# Patient Record
Sex: Male | Born: 1970 | Marital: Single
Health system: Southern US, Community
[De-identification: ages and names within clinical notes are randomized; demographics above are authoritative.]

---

## 2012-11-30 ENCOUNTER — Inpatient Hospital Stay: Payer: Self-pay | Admitting: Internal Medicine

## 2012-11-30 DIAGNOSIS — I517 Cardiomegaly: Secondary | ICD-10-CM

## 2012-11-30 LAB — MAGNESIUM: Magnesium: 1.9 mg/dL

## 2012-11-30 LAB — COMPREHENSIVE METABOLIC PANEL
Alkaline Phosphatase: 104 U/L (ref 50–136)
Anion Gap: 10 (ref 7–16)
Bilirubin,Total: 0.3 mg/dL (ref 0.2–1.0)
Calcium, Total: 9.3 mg/dL (ref 8.5–10.1)
Creatinine: 2.04 mg/dL — ABNORMAL HIGH (ref 0.60–1.30)
Osmolality: 286 (ref 275–301)
SGPT (ALT): 36 U/L (ref 12–78)
Sodium: 138 mmol/L (ref 136–145)
Total Protein: 9.2 g/dL — ABNORMAL HIGH (ref 6.4–8.2)

## 2012-11-30 LAB — URINALYSIS, COMPLETE
Bacteria: NONE SEEN
Cellular Cast: 7
Leukocyte Esterase: NEGATIVE
Nitrite: NEGATIVE
RBC,UR: 12 /HPF (ref 0–5)
Specific Gravity: 1.026 (ref 1.003–1.030)
Squamous Epithelial: 4
WBC UR: 9 /HPF (ref 0–5)

## 2012-11-30 LAB — CBC
HCT: 43.5 % (ref 40.0–52.0)
MCHC: 33 g/dL (ref 32.0–36.0)
Platelet: 170 10*3/uL (ref 150–440)
RBC: 4.51 10*6/uL (ref 4.40–5.90)
RDW: 15.4 % — ABNORMAL HIGH (ref 11.5–14.5)
WBC: 9.7 10*3/uL (ref 3.8–10.6)

## 2012-11-30 LAB — HEMOGLOBIN A1C: Hemoglobin A1C: 6.1 % (ref 4.2–6.3)

## 2012-11-30 LAB — DRUG SCREEN, URINE
Barbiturates, Ur Screen: NEGATIVE (ref ?–200)
Methadone, Ur Screen: NEGATIVE (ref ?–300)

## 2012-11-30 LAB — LIPASE, BLOOD: Lipase: 166 U/L (ref 73–393)

## 2012-11-30 LAB — PROTIME-INR
INR: 1
Prothrombin Time: 13.2 secs (ref 11.5–14.7)

## 2012-11-30 LAB — CK TOTAL AND CKMB (NOT AT ARMC)
CK, Total: 373 U/L — ABNORMAL HIGH (ref 35–232)
CK-MB: 1 ng/mL (ref 0.5–3.6)

## 2012-12-01 LAB — BASIC METABOLIC PANEL WITH GFR
Anion Gap: 4 — ABNORMAL LOW
BUN: 22 mg/dL — ABNORMAL HIGH
Calcium, Total: 8.5 mg/dL
Chloride: 104 mmol/L
Co2: 27 mmol/L
Creatinine: 1.8 mg/dL — ABNORMAL HIGH
EGFR (African American): 53 — ABNORMAL LOW
EGFR (Non-African Amer.): 46 — ABNORMAL LOW
Glucose: 98 mg/dL
Osmolality: 273
Potassium: 4.5 mmol/L
Sodium: 135 mmol/L — ABNORMAL LOW

## 2012-12-01 LAB — CBC WITH DIFFERENTIAL/PLATELET
Basophil #: 0 x10 3/mm 3
Basophil %: 0.3 %
Eosinophil #: 0.1 x10 3/mm 3
Eosinophil %: 0.7 %
HCT: 39.9 % — ABNORMAL LOW
HGB: 13.4 g/dL
Lymphocyte %: 12.5 %
Lymphs Abs: 1.3 x10 3/mm 3
MCH: 31.9 pg
MCHC: 33.7 g/dL
MCV: 95 fL
Monocyte #: 1.4 "x10 3/mm " — ABNORMAL HIGH
Monocyte %: 13.6 %
Neutrophil #: 7.5 x10 3/mm 3 — ABNORMAL HIGH
Neutrophil %: 72.9 %
Platelet: 149 x10 3/mm 3 — ABNORMAL LOW
RBC: 4.22 x10 6/mm 3 — ABNORMAL LOW
RDW: 15.4 % — ABNORMAL HIGH
WBC: 10.2 x10 3/mm 3

## 2012-12-01 LAB — MAGNESIUM: Magnesium: 1.7 mg/dL — ABNORMAL LOW

## 2012-12-01 LAB — APTT
Activated PTT: 112.8 s — ABNORMAL HIGH
Activated PTT: 92 s — ABNORMAL HIGH

## 2012-12-01 LAB — TSH: Thyroid Stimulating Horm: 1.25 u[IU]/mL

## 2012-12-02 LAB — BASIC METABOLIC PANEL
Anion Gap: 7 (ref 7–16)
Calcium, Total: 9.1 mg/dL (ref 8.5–10.1)
Chloride: 102 mmol/L (ref 98–107)
Co2: 25 mmol/L (ref 21–32)
Creatinine: 1.5 mg/dL — ABNORMAL HIGH (ref 0.60–1.30)
EGFR (African American): >60
Osmolality: 269 (ref 275–301)

## 2012-12-02 LAB — APTT: Activated PTT: 75 secs — ABNORMAL HIGH (ref 23.6–35.9)

## 2012-12-03 LAB — BASIC METABOLIC PANEL
Anion Gap: 6 — ABNORMAL LOW (ref 7–16)
BUN: 14 mg/dL (ref 7–18)
Calcium, Total: 9.2 mg/dL (ref 8.5–10.1)
Chloride: 104 mmol/L (ref 98–107)
Creatinine: 1.4 mg/dL — ABNORMAL HIGH (ref 0.60–1.30)
EGFR (African American): >60
EGFR (Non-African Amer.): >60
Osmolality: 272 (ref 275–301)
Potassium: 4.4 mmol/L (ref 3.5–5.1)

## 2012-12-03 LAB — PLATELET COUNT: Platelet: 179 10*3/uL (ref 150–440)

## 2012-12-03 LAB — HEMOGLOBIN: HGB: 13 g/dL (ref 13.0–18.0)

## 2012-12-03 LAB — APTT: Activated PTT: 51.5 secs — ABNORMAL HIGH (ref 23.6–35.9)

## 2012-12-04 LAB — BASIC METABOLIC PANEL
Anion Gap: 5 — ABNORMAL LOW (ref 7–16)
BUN: 13 mg/dL (ref 7–18)
Chloride: 105 mmol/L (ref 98–107)
Co2: 27 mmol/L (ref 21–32)
Creatinine: 1.39 mg/dL — ABNORMAL HIGH (ref 0.60–1.30)
EGFR (African American): >60
EGFR (Non-African Amer.): >60
Sodium: 137 mmol/L (ref 136–145)

## 2012-12-05 LAB — PLATELET COUNT: Platelet: 121 10*3/uL — ABNORMAL LOW (ref 150–440)

## 2012-12-05 LAB — APTT: Activated PTT: 35.9 secs (ref 23.6–35.9)

## 2012-12-05 LAB — HEMOGLOBIN: HGB: 11 g/dL — ABNORMAL LOW (ref 13.0–18.0)

## 2012-12-06 LAB — CBC WITH DIFFERENTIAL/PLATELET
Basophil #: 0 10*3/uL (ref 0.0–0.1)
Basophil %: 0.2 %
Eosinophil #: 0.2 10*3/uL (ref 0.0–0.7)
Eosinophil %: 2.3 %
HCT: 30.9 % — ABNORMAL LOW (ref 40.0–52.0)
HGB: 10.6 g/dL — ABNORMAL LOW (ref 13.0–18.0)
Lymphocyte #: 0.7 10*3/uL — ABNORMAL LOW (ref 1.0–3.6)
Lymphocyte %: 7.1 %
Monocyte #: 1.2 x10 3/mm — ABNORMAL HIGH (ref 0.2–1.0)
Neutrophil %: 78.3 %
Platelet: 99 10*3/uL — ABNORMAL LOW (ref 150–440)
RBC: 3.27 10*6/uL — ABNORMAL LOW (ref 4.40–5.90)
RDW: 15.1 % — ABNORMAL HIGH (ref 11.5–14.5)
WBC: 10.1 10*3/uL (ref 3.8–10.6)

## 2012-12-06 LAB — PROTIME-INR
INR: 1.2
Prothrombin Time: 14.9 secs — ABNORMAL HIGH (ref 11.5–14.7)

## 2012-12-06 LAB — CREATININE, SERUM
EGFR (African American): 17 — ABNORMAL LOW
EGFR (Non-African Amer.): 14 — ABNORMAL LOW

## 2012-12-07 LAB — CBC WITH DIFFERENTIAL/PLATELET
Basophil #: 0 10*3/uL (ref 0.0–0.1)
Basophil %: 0.2 %
Eosinophil #: 0.1 10*3/uL (ref 0.0–0.7)
Eosinophil %: 1.2 %
HCT: 28.7 % — ABNORMAL LOW (ref 40.0–52.0)
HGB: 9.5 g/dL — ABNORMAL LOW (ref 13.0–18.0)
Lymphocyte #: 0.8 10*3/uL — ABNORMAL LOW (ref 1.0–3.6)
Lymphocyte %: 6.8 %
MCH: 31 pg (ref 26.0–34.0)
MCHC: 33.1 g/dL (ref 32.0–36.0)
MCV: 94 fL (ref 80–100)
Monocyte #: 1.4 x10 3/mm — ABNORMAL HIGH (ref 0.2–1.0)
Neutrophil %: 79.5 %
RBC: 3.06 10*6/uL — ABNORMAL LOW (ref 4.40–5.90)
RDW: 14.8 % — ABNORMAL HIGH (ref 11.5–14.5)

## 2012-12-07 LAB — BASIC METABOLIC PANEL
BUN: 44 mg/dL — ABNORMAL HIGH (ref 7–18)
Co2: 25 mmol/L (ref 21–32)
Creatinine: 5.4 mg/dL — ABNORMAL HIGH (ref 0.60–1.30)
EGFR (African American): 14 — ABNORMAL LOW
EGFR (Non-African Amer.): 12 — ABNORMAL LOW
Glucose: 98 mg/dL (ref 65–99)
Sodium: 131 mmol/L — ABNORMAL LOW (ref 136–145)

## 2012-12-07 LAB — PROTIME-INR: INR: 1.2

## 2012-12-08 LAB — BASIC METABOLIC PANEL
BUN: 42 mg/dL — ABNORMAL HIGH (ref 7–18)
Calcium, Total: 8.4 mg/dL — ABNORMAL LOW (ref 8.5–10.1)
Creatinine: 5.16 mg/dL — ABNORMAL HIGH (ref 0.60–1.30)
EGFR (African American): 15 — ABNORMAL LOW
Glucose: 105 mg/dL — ABNORMAL HIGH (ref 65–99)
Osmolality: 279 (ref 275–301)
Potassium: 5.3 mmol/L — ABNORMAL HIGH (ref 3.5–5.1)

## 2012-12-08 LAB — URIC ACID: Uric Acid: 5.5 mg/dL (ref 3.5–7.2)

## 2012-12-08 LAB — CK: CK, Total: 127 U/L (ref 35–232)

## 2012-12-08 LAB — PROTIME-INR
INR: 1.4
Prothrombin Time: 16.7 secs — ABNORMAL HIGH (ref 11.5–14.7)

## 2012-12-08 LAB — PHOSPHORUS: Phosphorus: 4.2 mg/dL (ref 2.5–4.9)

## 2012-12-09 LAB — BASIC METABOLIC PANEL
Anion Gap: 4 — ABNORMAL LOW (ref 7–16)
BUN: 43 mg/dL — ABNORMAL HIGH (ref 7–18)
Calcium, Total: 8.8 mg/dL (ref 8.5–10.1)
Chloride: 104 mmol/L (ref 98–107)
Co2: 24 mmol/L (ref 21–32)
Creatinine: 4.89 mg/dL — ABNORMAL HIGH (ref 0.60–1.30)
EGFR (African American): 16 — ABNORMAL LOW
Osmolality: 275 (ref 275–301)

## 2012-12-09 LAB — CBC WITH DIFFERENTIAL/PLATELET
Eosinophil #: 0.2 10*3/uL (ref 0.0–0.7)
Eosinophil %: 1.6 %
HCT: 27.2 % — ABNORMAL LOW (ref 40.0–52.0)
Lymphocyte #: 0.8 10*3/uL — ABNORMAL LOW (ref 1.0–3.6)
MCH: 32 pg (ref 26.0–34.0)
Monocyte #: 1.8 x10 3/mm — ABNORMAL HIGH (ref 0.2–1.0)
Monocyte %: 14.3 %
Platelet: 171 10*3/uL (ref 150–440)
WBC: 12.7 10*3/uL — ABNORMAL HIGH (ref 3.8–10.6)

## 2012-12-09 LAB — PROTIME-INR: INR: 1.4

## 2012-12-09 LAB — PROTEIN / CREATININE RATIO, URINE: Creatinine, Urine: 93 mg/dL (ref 30.0–125.0)

## 2012-12-10 LAB — BASIC METABOLIC PANEL
Anion Gap: 6 — ABNORMAL LOW (ref 7–16)
BUN: 45 mg/dL — ABNORMAL HIGH (ref 7–18)
Calcium, Total: 8.3 mg/dL — ABNORMAL LOW (ref 8.5–10.1)
Chloride: 106 mmol/L (ref 98–107)
Co2: 21 mmol/L (ref 21–32)
Creatinine: 4.81 mg/dL — ABNORMAL HIGH (ref 0.60–1.30)
EGFR (African American): 16 — ABNORMAL LOW
EGFR (Non-African Amer.): 14 — ABNORMAL LOW
Glucose: 116 mg/dL — ABNORMAL HIGH (ref 65–99)
Osmolality: 279 (ref 275–301)

## 2012-12-10 LAB — CBC WITH DIFFERENTIAL/PLATELET
Basophil %: 0.5 %
Eosinophil #: 0.2 10*3/uL (ref 0.0–0.7)
Eosinophil %: 1.3 %
HCT: 25.9 % — ABNORMAL LOW (ref 40.0–52.0)
HGB: 8.7 g/dL — ABNORMAL LOW (ref 13.0–18.0)
MCH: 31.1 pg (ref 26.0–34.0)
MCHC: 33.4 g/dL (ref 32.0–36.0)
MCV: 93 fL (ref 80–100)
Neutrophil #: 11.1 10*3/uL — ABNORMAL HIGH (ref 1.4–6.5)
RBC: 2.78 10*6/uL — ABNORMAL LOW (ref 4.40–5.90)
RDW: 14.4 % (ref 11.5–14.5)

## 2012-12-10 LAB — PROTIME-INR
INR: 1.5
Prothrombin Time: 18 secs — ABNORMAL HIGH (ref 11.5–14.7)

## 2012-12-10 LAB — POTASSIUM: Potassium: 5.1 mmol/L (ref 3.5–5.1)

## 2012-12-10 LAB — APTT: Activated PTT: 45.9 secs — ABNORMAL HIGH (ref 23.6–35.9)

## 2012-12-11 LAB — BASIC METABOLIC PANEL
Anion Gap: 5 — ABNORMAL LOW (ref 7–16)
BUN: 44 mg/dL — ABNORMAL HIGH (ref 7–18)
Calcium, Total: 8.3 mg/dL — ABNORMAL LOW (ref 8.5–10.1)
Chloride: 106 mmol/L (ref 98–107)
EGFR (African American): 16 — ABNORMAL LOW
Glucose: 101 mg/dL — ABNORMAL HIGH (ref 65–99)
Osmolality: 285 (ref 275–301)
Potassium: 5 mmol/L (ref 3.5–5.1)

## 2012-12-11 LAB — CBC WITH DIFFERENTIAL/PLATELET
Basophil #: 0.1 10*3/uL (ref 0.0–0.1)
Basophil %: 0.6 %
Eosinophil #: 0.3 10*3/uL (ref 0.0–0.7)
HGB: 8.5 g/dL — ABNORMAL LOW (ref 13.0–18.0)
Lymphocyte %: 8.4 %
MCH: 30.9 pg (ref 26.0–34.0)
MCHC: 33.1 g/dL (ref 32.0–36.0)
MCV: 94 fL (ref 80–100)
Monocyte #: 1.4 x10 3/mm — ABNORMAL HIGH (ref 0.2–1.0)
Monocyte %: 12 %
Neutrophil #: 9.2 10*3/uL — ABNORMAL HIGH (ref 1.4–6.5)
Platelet: 268 10*3/uL (ref 150–440)
WBC: 12.1 10*3/uL — ABNORMAL HIGH (ref 3.8–10.6)

## 2012-12-11 LAB — APTT
Activated PTT: >160 secs (ref 23.6–35.9)
Activated PTT: 95.5 secs — ABNORMAL HIGH (ref 23.6–35.9)

## 2012-12-11 LAB — PROTIME-INR: INR: 1.9

## 2012-12-12 LAB — CBC WITH DIFFERENTIAL/PLATELET
Basophil #: 0.1 10*3/uL (ref 0.0–0.1)
HCT: 22.8 % — ABNORMAL LOW (ref 40.0–52.0)
Lymphocyte #: 0.8 10*3/uL — ABNORMAL LOW (ref 1.0–3.6)
MCH: 32.5 pg (ref 26.0–34.0)
MCHC: 34.9 g/dL (ref 32.0–36.0)
Monocyte #: 1.3 x10 3/mm — ABNORMAL HIGH (ref 0.2–1.0)
Monocyte %: 12 %
Neutrophil #: 8.5 10*3/uL — ABNORMAL HIGH (ref 1.4–6.5)
Platelet: 355 10*3/uL (ref 150–440)
RBC: 2.45 10*6/uL — ABNORMAL LOW (ref 4.40–5.90)
RDW: 14.7 % — ABNORMAL HIGH (ref 11.5–14.5)
WBC: 11 10*3/uL — ABNORMAL HIGH (ref 3.8–10.6)

## 2012-12-12 LAB — PROTIME-INR
INR: 1.9
Prothrombin Time: 21.5 secs — ABNORMAL HIGH (ref 11.5–14.7)

## 2012-12-12 LAB — BASIC METABOLIC PANEL
Anion Gap: 7 (ref 7–16)
BUN: 46 mg/dL — ABNORMAL HIGH (ref 7–18)
Calcium, Total: 8.4 mg/dL — ABNORMAL LOW (ref 8.5–10.1)
Co2: 21 mmol/L (ref 21–32)
Creatinine: 4.81 mg/dL — ABNORMAL HIGH (ref 0.60–1.30)
EGFR (African American): 16 — ABNORMAL LOW
Glucose: 113 mg/dL — ABNORMAL HIGH (ref 65–99)
Potassium: 4.8 mmol/L (ref 3.5–5.1)
Sodium: 135 mmol/L — ABNORMAL LOW (ref 136–145)

## 2012-12-12 LAB — APTT
Activated PTT: >160 secs (ref 23.6–35.9)
Activated PTT: 73 secs — ABNORMAL HIGH (ref 23.6–35.9)

## 2012-12-13 LAB — URINALYSIS, COMPLETE
Bilirubin,UR: NEGATIVE
Ketone: NEGATIVE
Leukocyte Esterase: NEGATIVE
Nitrite: NEGATIVE
Ph: 5 (ref 4.5–8.0)
Protein: NEGATIVE
RBC,UR: 1 /HPF (ref 0–5)
Specific Gravity: 1.006 (ref 1.003–1.030)
WBC UR: 1 /HPF (ref 0–5)

## 2012-12-13 LAB — BASIC METABOLIC PANEL
Anion Gap: 5 — ABNORMAL LOW (ref 7–16)
BUN: 46 mg/dL — ABNORMAL HIGH (ref 7–18)
Chloride: 108 mmol/L — ABNORMAL HIGH (ref 98–107)
Co2: 25 mmol/L (ref 21–32)
EGFR (African American): 16 — ABNORMAL LOW
Sodium: 138 mmol/L (ref 136–145)

## 2012-12-13 LAB — APTT: Activated PTT: 111.8 secs — ABNORMAL HIGH (ref 23.6–35.9)

## 2012-12-13 LAB — CBC WITH DIFFERENTIAL/PLATELET
Eosinophil #: 0.4 10*3/uL (ref 0.0–0.7)
HCT: 25.1 % — ABNORMAL LOW (ref 40.0–52.0)
HGB: 8.4 g/dL — ABNORMAL LOW (ref 13.0–18.0)
Neutrophil #: 8.3 10*3/uL — ABNORMAL HIGH (ref 1.4–6.5)
Neutrophil %: 75.4 %
Platelet: 399 10*3/uL (ref 150–440)
RBC: 2.65 10*6/uL — ABNORMAL LOW (ref 4.40–5.90)
RDW: 14.8 % — ABNORMAL HIGH (ref 11.5–14.5)

## 2012-12-13 LAB — PROTEIN / CREATININE RATIO, URINE
Creatinine, Urine: 53.4 mg/dL (ref 30.0–125.0)
Protein, Random Urine: 11 mg/dL (ref 0–12)

## 2012-12-13 LAB — PROTIME-INR
INR: 2.4
Prothrombin Time: 25.6 secs — ABNORMAL HIGH (ref 11.5–14.7)

## 2012-12-14 LAB — BASIC METABOLIC PANEL
BUN: 42 mg/dL — ABNORMAL HIGH (ref 7–18)
Calcium, Total: 8.3 mg/dL — ABNORMAL LOW (ref 8.5–10.1)
Creatinine: 4.4 mg/dL — ABNORMAL HIGH (ref 0.60–1.30)
EGFR (Non-African Amer.): 16 — ABNORMAL LOW
Osmolality: 289 (ref 275–301)

## 2012-12-14 LAB — PROTIME-INR: Prothrombin Time: 28.9 secs — ABNORMAL HIGH (ref 11.5–14.7)

## 2012-12-15 LAB — CBC WITH DIFFERENTIAL/PLATELET
Basophil %: 0.5 %
HCT: 23.1 % — ABNORMAL LOW (ref 40.0–52.0)
Lymphocyte #: 0.8 10*3/uL — ABNORMAL LOW (ref 1.0–3.6)
Lymphocyte %: 7.5 %
MCH: 32.3 pg (ref 26.0–34.0)
MCHC: 34.2 g/dL (ref 32.0–36.0)
MCV: 95 fL (ref 80–100)
Neutrophil %: 78.1 %
Platelet: 382 10*3/uL (ref 150–440)
RDW: 14.8 % — ABNORMAL HIGH (ref 11.5–14.5)

## 2012-12-15 LAB — BASIC METABOLIC PANEL
BUN: 42 mg/dL — ABNORMAL HIGH (ref 7–18)
Calcium, Total: 8.6 mg/dL (ref 8.5–10.1)
Chloride: 108 mmol/L — ABNORMAL HIGH (ref 98–107)
Co2: 22 mmol/L (ref 21–32)
Creatinine: 4.6 mg/dL — ABNORMAL HIGH (ref 0.60–1.30)
EGFR (African American): 17 — ABNORMAL LOW
Glucose: 93 mg/dL (ref 65–99)

## 2012-12-15 LAB — PROTIME-INR: INR: 3.1

## 2012-12-16 LAB — BASIC METABOLIC PANEL
Anion Gap: 6 — ABNORMAL LOW (ref 7–16)
BUN: 37 mg/dL — ABNORMAL HIGH (ref 7–18)
Calcium, Total: 8.7 mg/dL (ref 8.5–10.1)
EGFR (Non-African Amer.): 18 — ABNORMAL LOW
Glucose: 88 mg/dL (ref 65–99)
Osmolality: 286 (ref 275–301)
Sodium: 139 mmol/L (ref 136–145)

## 2012-12-16 LAB — PROTIME-INR
INR: 2.9
Prothrombin Time: 29.1 secs — ABNORMAL HIGH (ref 11.5–14.7)

## 2012-12-17 LAB — BASIC METABOLIC PANEL
Anion Gap: 6 — ABNORMAL LOW (ref 7–16)
Calcium, Total: 8.9 mg/dL (ref 8.5–10.1)
Co2: 23 mmol/L (ref 21–32)
Creatinine: 3.73 mg/dL — ABNORMAL HIGH (ref 0.60–1.30)
EGFR (Non-African Amer.): 19 — ABNORMAL LOW
Glucose: 89 mg/dL (ref 65–99)
Potassium: 4.9 mmol/L (ref 3.5–5.1)
Sodium: 139 mmol/L (ref 136–145)

## 2014-05-03 IMAGING — XA IR VASCULAR PROCEDURE
9 of 10 series · 15 of 24 positions shown · non-contrast
Comparison: none

[Series 1: care aorta · 3 acquisitions, 1 frame shown (1 of 8)]
[im 1/3]
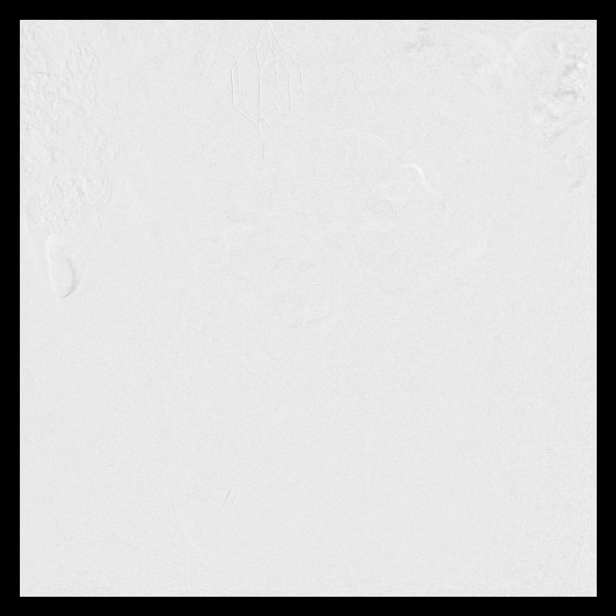

[Series 2: care aorta · 2 acquisitions, 1 frame shown (2 of 8)]
[im 1/2]
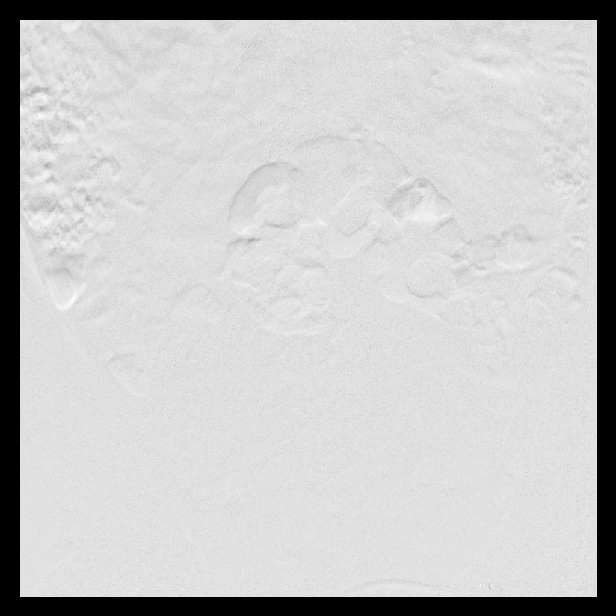

[Series 3: care aorta · 3 acquisitions, 2 frames shown (3 of 8)]
[im 1/3  full-range]
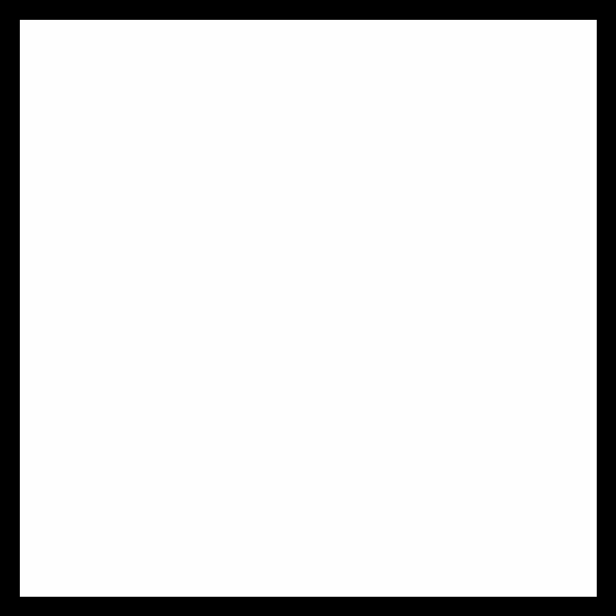
[im 1/3]
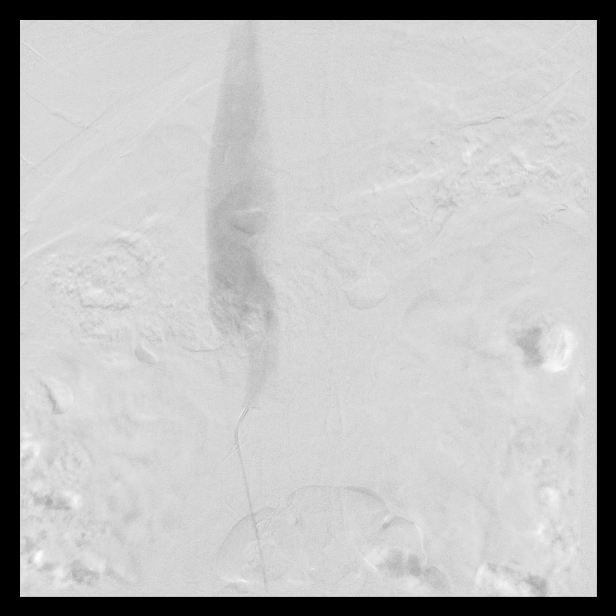

[Series 4: care aorta · 3 acquisitions, 2 frames shown (4 of 8)]
[im 1/3  full-range]
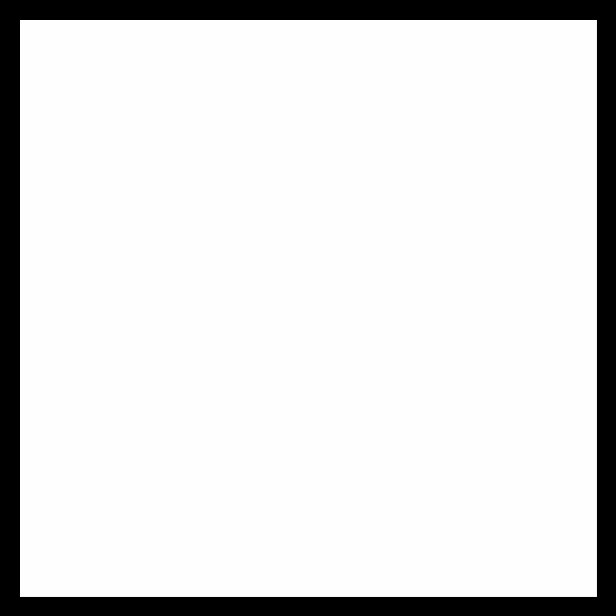
[im 1/3]
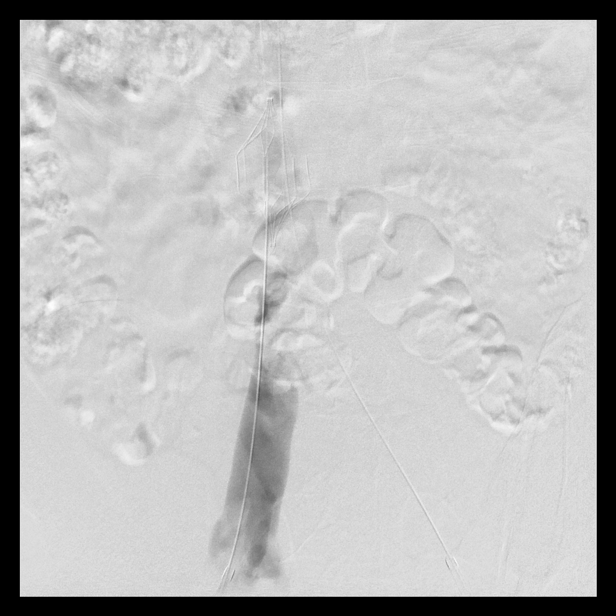

[Series 5: care aorta · 3 acquisitions, 2 frames shown (5 of 8)]
[im 1/3]
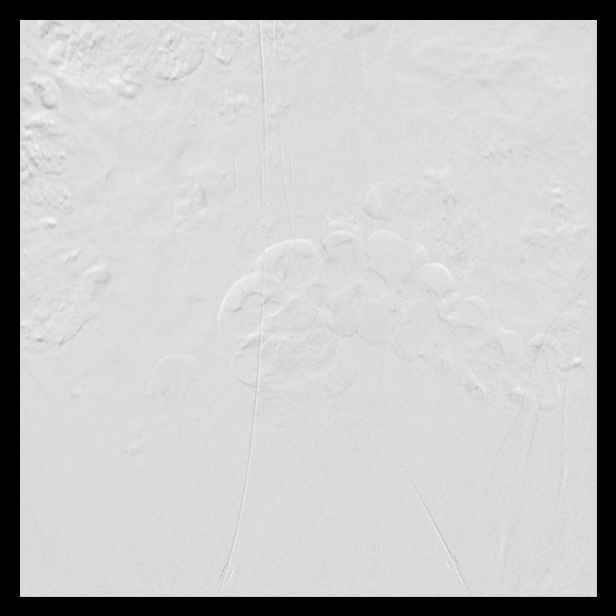
[im 2/3]
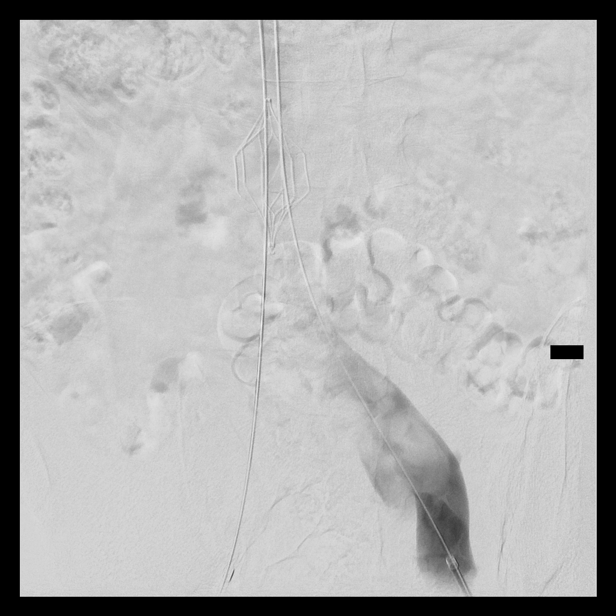

[Series 6: fl - angio · 1 of 2 slices shown]
[im 1/2]
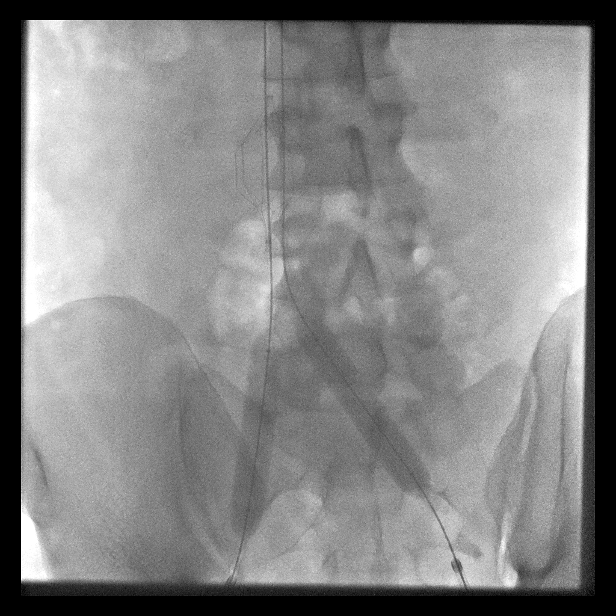

[Series 8: care aorta · 3 acquisitions, 2 frames shown (6 of 8)]
[im 1/3]
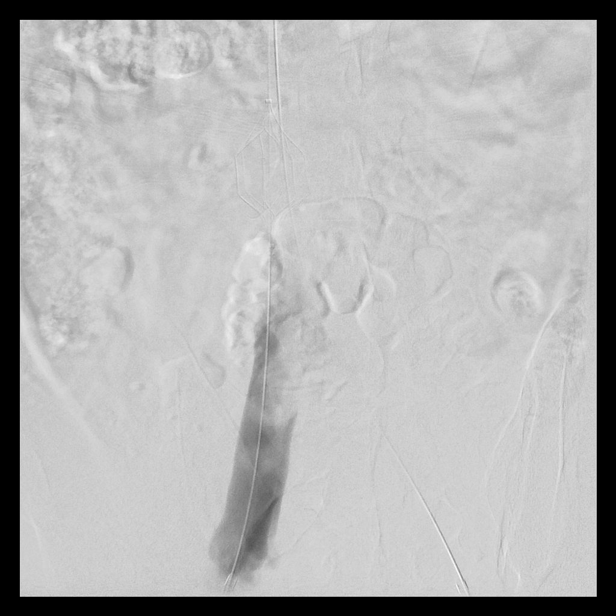
[im 1/3]
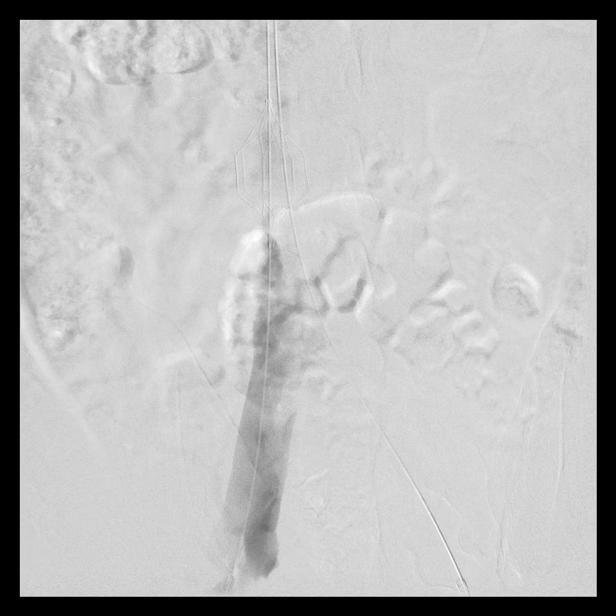

[Series 9: care aorta · 3 acquisitions, 2 frames shown (7 of 8)]
[im 1/3]
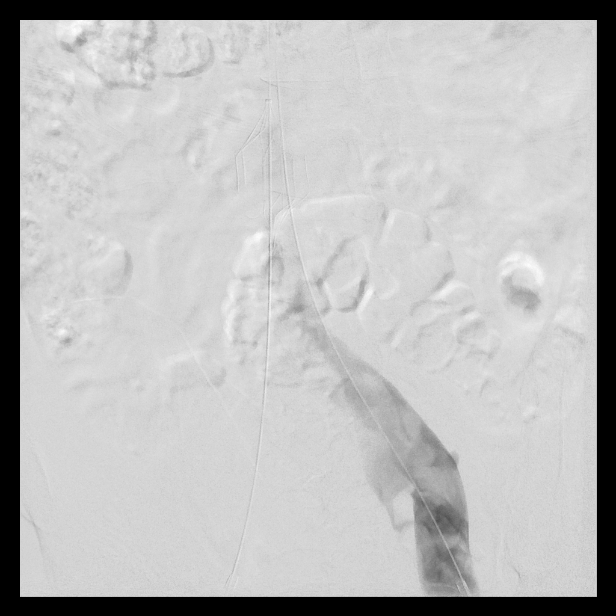
[im 3/3]
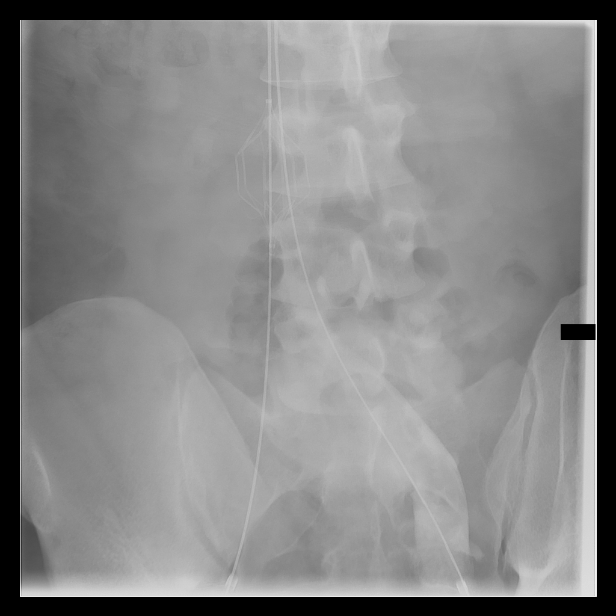

[Series 10: care aorta · 3 acquisitions, 2 frames shown (8 of 8)]
[im 1/3  full-range]
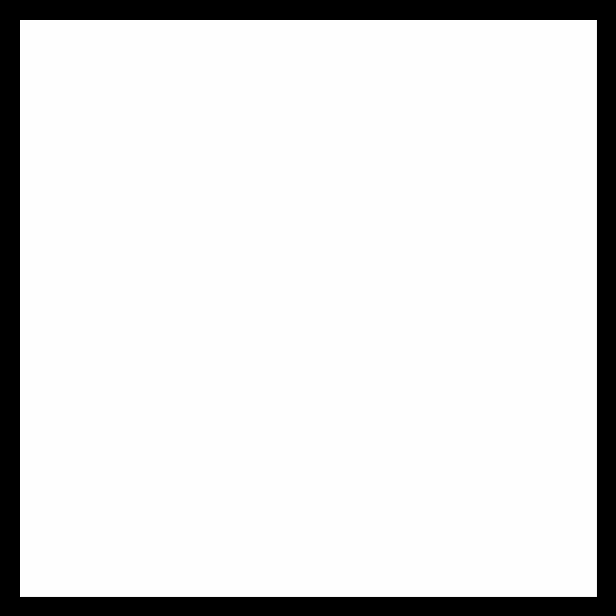
[im 3/3]
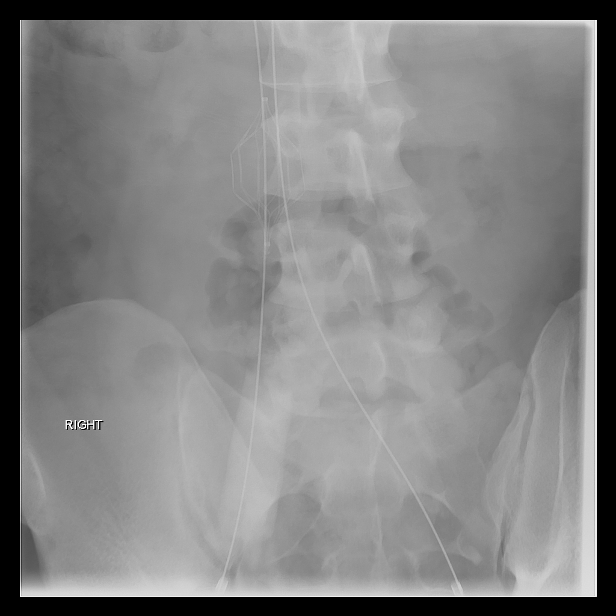

[15 of 24 positions shown; findings below may reference images not displayed]

IMAGES IMPORTED FROM THE SYNGO WORKFLOW SYSTEM
NO DICTATION FOR STUDY

## 2014-11-08 NOTE — Consult Note (Signed)
History of Present Illness:  Reason for Consult recurrent DVT   HPI   The patient is a 44 year old male with a known history of lower extremity recurrent DVT, status post IVC filter. He is being admitted for a new DVT. The patient was doing fine until 11/26/12 when he went to visit his mom for Mother's Day when he started having a lot of leg pains walking up the hill. Overnight developed  very swollen, achy and tight thighs and legs and decided to come to the Emergency Department. While in the EDon 11/30/12, he underwent ultrasound lower extremity which showed a possible DVT and he is being admitted for further evaluation and management. He is still having a lot of pain, 8/10 in severity in the lower extremitieschest pain. No shortness of breath.  PFSH:  Family History noncontributory, no history of DVT   Social History negative alcohol, negative tobacco   Review of Systems:  General pain  LE   Performance Status (ECOG) 2   HEENT no complaints   Lungs no complaints   Cardiac no complaints   GI no complaints   GU no complaints   Musculoskeletal no complaints   Extremities no complaints  pain  Right LE   Skin no complaints   Neuro no complaints   Endocrine no complaints   Psych no complaints   NURSING NOTES: **Vital Signs.:   15-May-14 10:37   Vital Signs Type: Admission   Temperature Temperature (F): 98.1   Celsius: 36.7   Temperature Source: oral   Pulse Pulse: 116   Respirations Respirations: 18   Systolic BP Systolic BP: 115   Diastolic BP (mmHg) Diastolic BP (mmHg): 82   Mean BP: 93   Pulse Ox % Pulse Ox %: 97   Pulse Ox Activity Level: At rest   Oxygen Delivery: Room Air/ 21 %   Physical Exam:  General young male in no acute distress   HEENT: normal   Lungs: clear   Cardiac: regular rate, rhythm   Breast: not examined   Abdomen: soft  nontender  positive bowel sounds   Skin: intact   Extremities: edema  swelling  LE   Neuro:  AAOx3   Psych: normal appearance    Morphine: Unknown  Radiology Results:  [place]:    15-May-14 08:30, [place] Color Flow Doppler Low Extrem Bilat (Legs)  [place] Color Flow Doppler Low Extrem Bilat (Legs)   REASON FOR EXAM:    leg pains dvt on coumadin swelling  COMMENTS:       PROCEDURE: [place]  - US DOPPLER LOW EXTR BILATERAL  - Nov 30 2012  8:30AM     RESULT: The study is quite limited due to the patient's body habitus and   due to lower extremity discomfort.    Grayscale and color flow Doppler techniques were employed to evaluate the   deep veins of both lower extremities. On the right the common femoral,   superficial femoral, and popliteal veins demonstrate abnormal internal   echoes. There are not normally compressible. There does appear to be a   small amount of vascular flow but it is not normal. Rouleaux formation is   suspected. On the left the common femoral vein could not be assessed. A   similar appearance of the superficial femoral and popliteal veins on the     left is present.    IMPRESSION:  The study is nondiagnostic but suspicious for abnormal flow   within the deep veins of both lower extremities.  The study is limited due   to body habitus and pain. It may be useful to consider the patient for CT   scanning of the abdomen and pelvis in an effort to detect obstructive   processes more proximally in the veins. Given that there is a filter in   the inferior vena cava thrombus may have accumulated within the cava.     Dictation Site: 2        Verified By: DAVID A. SwazilandJORDAN, M.D., MD   Assessment and Plan: Impression:   44 year old male with recurrent DVT. INR so probable non-compliance on Coumadin Plan:   #1 recurrent DVT/IVC thrombus with previous IVC filter placed a few years ago-patient on in intravenous heparin.  Has been evaluated by  Dr. Wyn Quakerew and is planned for  thrombolectomy next week provided renal function improvesalso need hypercoaguable workup for DVT.need  lifelong anticoagulation as he has had recurrent DVT's.Renal impairment- on aggressive hydration.plan on outpatient followup after thrombectomy.appreciated.  Electronic Signatures: Antony Hasteamiah, Ridhima Golberg S (MD)  (Signed 22-May-14 13:36)  Authored: HISTORY OF PRESENT ILLNESS, PFSH, ROS, NURSING NOTES, PE, ALLERGIES, HOME MEDICATIONS, OTHER RESULTS, ASSESSMENT AND PLAN   Last Updated: 22-May-14 13:36 by Antony Hasteamiah, Kjell Brannen S (MD)

## 2014-11-08 NOTE — H&P (Signed)
PATIENT NAME:  Albert Hernandez, Albert Hernandez MR#:  [id number] DATE OF BIRTH:  Mar 13, 1971  DATE OF ADMISSION:  11/30/2012  Nalaysia Manganiello S. Sherryll BurgerShah dictating for Hospitalist.  PRIMARY CARE PHYSICIAN:  None.  REQUESTING PHYSICIAN:  Dr. Enedina FinnerGoli.  CHIEF COMPLAINT:  Leg pain.  HISTORY OF PRESENT ILLNESS:  The patient is a 44 year old male with a known history of lower extremity recurrent DVT, status post IVC filter. He is being admitted for a new DTV. The patient was doing fine until this Sunday when he went to visit his mom for Mother's Day when he started having a lot of leg pains walking up the hill. He tried to manage it until this morning when he woke up from the sleep with very swollen, achy and tight thighs and legs and decided to come to the Emergency Department. While in the ED, he underwent ultrasound lower extremity which showed a possible DVT and he is being admitted for further evaluation and management. He is still having a lot of pain, 8/10 in severity in the lower extremities.   PAST MEDICAL HISTORY:  Recurrent DVT.  ALLERGIES:  MORPHINE.   MEDICATIONS AT HOME:  The patient claims that he is taking Coumadin 5 mg 2 tablets every day, although his INR is 1.1.   SOCIAL HISTORY:  No smoking, no alcohol. No IV drugs of abuse.   FAMILY HISTORY:  Father had gout. Mother had hypertension.   REVIEW OF SYSTEMS:  CONSTITUTIONAL:  No fever, fatigue, weakness.  EYES:  No blurred or double vision.  ENT:  No tinnitus or ear pain.  RESPIRATORY:  No cough, wheezing, hemoptysis.  CARDIOVASCULAR:  No chest pain, orthopnea, or edema.  GASTROINTESTINAL:  No nausea, vomiting, diarrhea.  GENITOURINARY:  No dysuria or hematuria.  ENDOCRINE:  No polyuria or nocturia. HEMATOLOGY:  No anemia or easy bruising. SKIN:  No rash or lesion.  MUSCULOSKELETAL:  Recurrent DVT and leg cramps and pain.  NEUROLOGIC: No tingling, numbness, weakness or tenderness. PSYCHIATRIC:  No history of anxiety or depression.   PHYSICAL EXAMINATION:   VITAL SIGNS:  Temperature 98.2, heart rate 120 per minute, respirations 26 per minute, blood pressure 99/68 mmHg. He is saturating 100% on room air.  GENERAL:  The patient is a 44 year old African American male lying in bed in some pain, mainly in the lower extremities.  EYES:  Pupils equal, round, and reactive to light and accommodation. No scleral icterus. Extraocular muscles intact.  HEAD:  Atraumatic, normocephalic. Oropharynx and nasopharynx are clear.  NECK:  Supple. No jugular venous distention. No thyroid enlargement. LUNGS:  Clear to auscultation bilaterally. No wheezing, rales, rhonchi or crepitation.  CARDIOVASCULAR:  S1, S2 normal. No murmurs, rubs or gallops. ABDOMEN:  Soft, nontender, nondistended. Bowel sounds present. No organomegaly or masses.  EXTREMITIES:  No pedal edema, cyanosis, or clubbing. He does have some tightening of his skin on the thigh bilaterally. He has multiple tattoos all over his body, mainly on the hands and upper chest. SKIN:  No other obvious rash, lesion, or ulcer.  NEUROLOGIC:  Cranial nerves II through XII intact. Muscle strength 5/5. Extremity sensation intact.  PSYCHIATRIC:  The patient is oriented to time, place, and person x 3.  LABORATORY, DIAGNOSTIC, AND RADIOLOGICAL DATA:  Sodium 138, potassium 2.6, chloride 103, CO2 25, BUN 19, creatinine 2.04. Blood sugar 237. Normal liver function tests except total protein of 9.2, AST of 39. Normal CBC. Normal first set of cardiac enzymes except. CK of 373. Normal coagulation panel with INR 1.0. D-dimer of  more than 6.   Chest x-ray while in the ED showed no acute cardiopulmonary disease, possible dextrocardia.   Bilateral lower extremity Dopplers on May 15th showed abnormal flow within the deep veins of the both lower extremities, study limited due to body habitus and pain. Possible obstructive process more proximally in the veins given there is a filter in the IVC, possible thrombus cannot be excluded within  vena cava.    Abdominal ultrasound on May 15th was not helpful looking at [facility] clot.   Today's EKG showed sinus tachycardia, right atrial enlargement. No major ST-T changes.   IMPRESSION AND PLAN:  1.  Recurrent deep vein thrombosis in the lower extremities, status post inferior vena cava filter in 2010. His initial deep vein thrombosis was in 2008, 2009 when he had it on the left leg, had an inferior vena cava filter placed in 2010 and in spite of being on anticoagulation he had a recurrent deep venous thrombosis. This was placed in [facility]. He has been following with [facility], and has been on Coumadin, or he claims, although his INR is 1.1, which show a possible noncompliance or Coumadin not working or Coumadin resistance. For now we will start him on heparin drip, obtain Vascular Surgery consult. I have discussed the case with Dr. Wyn Quaker. We will also consult with Hematology/Oncology for further workup including anticoagulation plans. We will monitor him on telemetry.  2.  Acute renal failure, likely prerenal in nature, cannot rule out underlying chronic kidney disease. I do not have any previous records for him. We will hydrate him aggressively and recheck his kidney function and would avoid any nephrotoxins.  3.  Hypokalemia. We will replete and recheck.  4.  Hyperglycemia. We will check hemoglobin A1c. This could be due to stress.   CODE STATUS:  FULL CODE.   ____________________________ Jakari Jacot S. Sherryll Burger, MD vss:jm D: 11/30/2012 16:59:14 ET T: 11/30/2012 17:23:33 ET JOB#: [id number]  cc: Melah Ebling S. Sherryll Burger, MD, <Dictator> Annice Needy, MD Ellamae Sia [facility] MD ELECTRONICALLY SIGNED 12/01/2012 14:25

## 2014-11-08 NOTE — Discharge Summary (Signed)
PATIENT NAME:  Albert Hernandez, Albert Hernandez MR#:  [id number] DATE OF BIRTH:  [birth year]  DATE OF ADMISSION:  11/30/2012 DATE OF DISCHARGE:  12/17/2012  DISPOSITION: To [employer] [facility].   CONSULTATIONS: Nephrology consult with Dr. Anthonette Legato.   DISCHARGE DIAGNOSES:  1. Bilateral lower extremity pain secondary to recurrent deep vein thrombosis.  2. Acute nonoliguric renal failure secondary to contrast-induced nephropathy, resolving.  3. Obesity.  4. Hypertension.   DISCHARGE MEDICATIONS: Coumadin 9 mg at night, metoprolol 25 mg p.o. b.i.d. and Norco 5/325 q.4 to 6 hours p.r.n. for pain.   HOSPITAL COURSE:  1. The patient is a 44 year old obese male with recurrent DVTs, status post IVC filter in 2010. The patient came in with recurrent DVT with IVC clot. Seen by Dr. Lucky Cowboy, vascular surgeon, and also hematology oncology. The patient had IVC filter clot removed with direct thrombolysis and thrombectomy. The patient right now restarted on Coumadin, and INR is therapeutic around 2.3 today, and his leg swelling is better than before but he has not been walking much, so physical therapy recommended rehab. So, the patient's wife and the patient accepted offer at [facility] and he will be going there.  2. Acute nonoliguric renal failure due to contrast-induced nephropathy. The patient's INR initially was 1 on admission. Renal function on May 15th showed creatinine of 2. His creatinine actually was 1.39 on May 19th which improved but after the thrombectomy, creatinine steadily went up and it was 5.4 by May 22nd, so Munsoor Lateef saw the patient. The patient had compliment levels which were slightly up at 184. The patient had been started on IV fluids. He also had  workup of which was negative and the patient thought to have contrast-induced nephropathy. The patient had good urine output anyway all through, so did not need dialysis and the patient's IV fluids were stopped on May 30th. The patient's creatinine  actually is coming down to 3.7. Dr. Anthonette Legato said the patient can be discharged and he will follow up with him in the office in about 2 to 3 days. The patient also had ANCA levels which were negative for p-ANCA c-ANCA. The patient also had anti-GBM antibodies which were also around 4 units. That is negative as well.  3. Recurrent DVT: Seen by Dr. Kennieth Francois, oncologist. The patient needs lifetime anticoagulation, and he needs to be compliant with medications. The patient had anticardiolipin antibodies for IgG, IgA, IgM which were all negative. Lupus anticoagulant is also showing negative. 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   Refer to Dr. Remer Macho interim discharge dictation as well. The patient is to follow up with Dr. Anthonette Legato and keep up with medications. The patient's thrombin time is elevated to a value of more than 120 seconds. Dr. Kennieth Francois recommended lifetime anticoagulation.   TIME SPENT ON DISCHARGE PREPARATION: More than 30 minutes. Explained to the wife that the patient needs to see Dr. Anthonette Legato in 1 to 2 days  and he should be compliant with medications.    ____________________________ Epifanio Lesches, MD sk:gb D: 12/17/2012 11:06:36 ET T: 12/17/2012 21:53:05 ET JOB#: [id number]  cc: Epifanio Lesches, MD, <Dictator> Munsoor Lilian Kapur, MD Rae Halsted. Kallie Edward, MD Epifanio Lesches MD ELECTRONICALLY SIGNED 12/24/2012 13:24

## 2014-11-08 NOTE — Op Note (Signed)
PATIENT NAME:  Albert Hernandez, Masami MR#:  [id number] DATE OF BIRTH:  Oct 29, 1970  DATE OF PROCEDURE:  12/04/2012  PREOPERATIVE DIAGNOSES: 1.  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  2.  History of deep vein thrombosis with likely hypercoagulable state.  POSTOPERATIVE DIAGNOSES:  1.  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  2.  History of deep vein thrombosis with likely hypercoagulable state.  PROCEDURES PERFORMED: 1.  Ultrasound guidance for vascular access to bilateral femoral veins.  2.  Catheter placement into inferior vena cava from bilateral femoral approaches.  3.  Bilateral iliofemoral venograms and IVC gram.  4.  Catheter directed thrombolysis with 8 mg of TPA delivered to bilateral iliac veins and inferior vena cava with the AngioJet Proxi catheter.  5.  Mechanical rheolytic thrombectomy to same veins with AngioJet Proxi catheter.  6.  Percutaneous transluminal angioplasty of bilateral iliac veins and vena cava below the filter with 12 mm diameter angioplasty balloon in a kissing balloon fashion up into the vena cava.   SURGEON: Annice NeedyJason S. Dew, M.D.   ANESTHESIA: Local with moderate conscious sedation.   ESTIMATED BLOOD LOSS: Minimal.  CONTRAST USED: 35 mL Visipaque.   FLUOROSCOPY TIME: 8 minutes.   INDICATION FOR PROCEDURE: This is a 44 year old African American male who is admitted with severe leg swelling and pain with ultrasound evidence of pelvic or caval thrombosis. Clinically this fit.  He had had a previous IVC filter placed at a different institution several years ago. He was on Coumadin, but was subtherapeutic on his arrival.  He had also had some acute renal failure issues so he was hydrated for several days to improve his renal function prior to proceeding with any thrombolysis.  It had only been about 1 week since the initiation  of his symptoms. Risks and benefits were discussed and informed consent was obtained.   DESCRIPTION OF PROCEDURE: The patient was brought to the vascular and interventional radiology suite. Groins were shaved and prepped and a sterile surgical field was created. Bilateral femoral veins were ultrasound sequentially. Under direct ultrasound guidance, the left first and the right second, and 8-French sheaths were placed.  A permanent image was recorded 3000 units of additional heparin were given for systemic anticoagulation. Imaging showed thrombosis of bilateral iliac veins with caval thrombosis. I was able to cross across this area with a Magic torque wire and Kumpe catheter.  In the vena cava just above the filter, there was a tail of thrombus on the filter, but above this the vena cava was patent. I then instilled 8 mg of TPA in both iliac veins and inferior vena cava encompassing the area of thrombosis with the AngioJet Proxi catheter. This was allowed to dwell for approximately 20 minutes. I then performed mechanical rheolytic thrombectomy from both groins encompassing the right iliac vein, the left iliac vein, and the inferior vena cava through the portion with the filter and just above the filter. About 275 to 300 mL of effluent returned on several passes with the AngioJet device. Balloon angioplasty was also performed in the iliac veins and then up into the vena cava to the level of the filter with 12 mm diameter angioplasty balloons in somewhat of a kissing balloon fashion. Again, the vena cava and both iliac veins were treated in this fashion. Completion angiogram following this showed some residual thrombosis, but there was now at least a  channel of flow that was significantly improved from his baseline that had no flow through this region. This was seen through both groins. At this point, I elected to terminate the procedure. Both sheaths were removed and pressure was held. Sterile dressing was placed.  The patient tolerated the procedure well and was taken to the recovery room in stable condition. ____________________________ Annice Needy, MD jsd:sb D: 12/04/2012 10:14:57 ET T: 12/04/2012 12:51:54 ET JOB#: [id number]  cc: Annice Needy, MD, <Dictator> Annice Needy MD ELECTRONICALLY SIGNED 12/04/2012 13:56

## 2014-11-08 NOTE — Consult Note (Signed)
Brief Consult Note: Diagnosis: Recurrent DVT.   Comments: Patient with several prior DVT. Unclear re compliance on Coumadin. Will check thrombophilia panel. Possible lysis/IVC filter removal next week. Will plan on longterm lefelong anticoagulation once surgery completed. Consult appreciated.  Electronic Signatures: Antony Hasteamiah, Darnel Mchan S (MD)  (Signed [phone]-May-14 08:45)  Authored: Brief Consult Note   Last Updated: 16-May-14 08:45 by Antony Hasteamiah, Florita Nitsch S (MD)

## 2014-11-08 NOTE — Consult Note (Signed)
Patient with 3-4 day history of LE pain and swelling.  Has h/o DVT and IVC filter placed 4 years ago at another hospital.  Was subtherapeutic on coumadin.  US suggests thrombus up into the IVC.  Suspect IVC filter thrombosis.  His ARF comlicates things, but we have up to two weeks to do a thrombolysis procedure after start of symptoms.  Agree with hydration.  If renal function improves will plan thrombolysis next week  Electronic Signatures: Annice Needyew, Jason S (MD)  (Signed on 15-May-14 17:56)  Authored  Last Updated: 15-May-14 17:56 by Annice Needyew, Jason S (MD)

## 2018-02-22 ENCOUNTER — Telehealth (INDEPENDENT_AMBULATORY_CARE_PROVIDER_SITE_OTHER): Payer: Self-pay | Admitting: Vascular Surgery

## 2018-02-22 NOTE — Telephone Encounter (Signed)
Message was left on the voicemail for Albert Hernandez to return my call
